# Patient Record
Sex: Female | Born: 2005
Health system: Southern US, Community
[De-identification: ages and names within clinical notes are randomized; demographics above are authoritative.]

## PROBLEM LIST (undated history)

## (undated) DIAGNOSIS — K900 Celiac disease: Secondary | ICD-10-CM

## (undated) DIAGNOSIS — N289 Disorder of kidney and ureter, unspecified: Secondary | ICD-10-CM

## (undated) HISTORY — DX: Celiac disease: K90.0

## (undated) HISTORY — PX: CYSTOSCOPY DEFLUX PROCEDURE: SHX6635

---
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
## 2023-02-18 DIAGNOSIS — D485 Neoplasm of uncertain behavior of skin: Secondary | ICD-10-CM | POA: Diagnosis not present

## 2023-02-18 DIAGNOSIS — L859 Epidermal thickening, unspecified: Secondary | ICD-10-CM | POA: Diagnosis not present

## 2023-02-20 ENCOUNTER — Encounter: Payer: Self-pay | Admitting: Physician Assistant

## 2023-02-20 ENCOUNTER — Ambulatory Visit: Payer: BC Managed Care – PPO | Admitting: Physician Assistant

## 2023-02-20 VITALS — BP 127/71 | HR 86 | Temp 98.2°F | Ht 62.02 in | Wt 126.2 lb

## 2023-02-20 DIAGNOSIS — R109 Unspecified abdominal pain: Secondary | ICD-10-CM | POA: Diagnosis not present

## 2023-02-20 LAB — POCT URINALYSIS DIP (MANUAL ENTRY)
Bilirubin, UA: NEGATIVE
Blood, UA: NEGATIVE
Glucose, UA: NEGATIVE mg/dL
Ketones, POC UA: NEGATIVE mg/dL
Nitrite, UA: NEGATIVE
Protein Ur, POC: NEGATIVE mg/dL
Spec Grav, UA: 1.015 (ref 1.010–1.025)
Urobilinogen, UA: 0.2 U/dL
pH, UA: 6.5 (ref 5.0–8.0)

## 2023-02-20 MED ORDER — BACLOFEN 10 MG PO TABS
10.0000 mg | ORAL_TABLET | Freq: Two times a day (BID) | ORAL | 0 refills | Status: DC
Start: 2023-02-20 — End: 2023-03-05

## 2023-02-20 NOTE — Progress Notes (Signed)
Established patient visit   Patient: Jade Wolfe   DOB: Jul 15, 2005   17 y.o. Female  MRN: [id number] Visit Date: 02/20/2023  Today's healthcare provider: Alfredia Ferguson, PA-C   Chief Complaint  Patient presents with   Back Pain    Patient states she does have scoliosis. Started in both sides- now its the right side, burning sensation. Kinda of leaning down in the leg now. Cramping in the front, lower abdominal.    Subjective    Pt reports right sided burning back pain for the last 2 weeks, but worse over the last 2-3 days. Worse when lying/sitting/bending straight. H/o scoliosis, but no bracing or prior flares.  Denies any injuries.  She has a history of vesicourethral reflux s/p deflux procedure, and history of cysts on right kidney.  She denies hematuria, dysuria, frequency.  Medications: Outpatient Medications Prior to Visit  Medication Sig   fluvoxaMINE (LUVOX) 100 MG tablet Take 100 mg by mouth at bedtime.   meloxicam (MOBIC) 15 MG tablet Take 1 tablet (15 mg total) by mouth daily.   No facility-administered medications prior to visit.   Review of Systems  Constitutional:  Negative for fatigue and fever.  Respiratory:  Negative for cough and shortness of breath.   Cardiovascular:  Negative for chest pain and leg swelling.  Gastrointestinal:  Positive for abdominal pain.  Musculoskeletal:  Positive for back pain.  Neurological:  Negative for dizziness and headaches.       Objective    BP 127/71   Pulse 86   Temp 98.2 F (36.8 C)   Ht 5' 2.02" (1.575 m)   Wt 126 lb 4 oz (57.3 kg)   LMP 01/16/2023 (Approximate)   SpO2 100%   BMI 23.08 kg/m    Physical Exam Vitals reviewed.  Constitutional:      Appearance: She is not ill-appearing.  HENT:     Head: Normocephalic.  Eyes:     Conjunctiva/sclera: Conjunctivae normal.  Cardiovascular:     Rate and Rhythm: Normal rate.  Pulmonary:     Effort: Pulmonary effort is normal. No respiratory distress.   Musculoskeletal:     Comments: Negative straight leg raise bilaterally. No pain with rom of b/l hips . No visible rashes or edema of back  Pain with flexion of lumbar spine  Neurological:     General: No focal deficit present.     Mental Status: She is alert and oriented to person, place, and time.  Psychiatric:        Mood and Affect: Mood normal.        Behavior: Behavior normal.     Results for orders placed or performed in visit on 02/20/23  POCT urinalysis dipstick  Result Value Ref Range   Color, UA yellow yellow   Clarity, UA clear clear   Glucose, UA negative negative mg/dL   Bilirubin, UA negative negative   Ketones, POC UA negative negative mg/dL   Spec Grav, UA 4.098 1.191 - 1.025   Blood, UA negative negative   pH, UA 6.5 5.0 - 8.0   Protein Ur, POC negative negative mg/dL   Urobilinogen, UA 0.2 0.2 or 1.0 E.U./dL   Nitrite, UA Negative Negative   Leukocytes, UA Trace (A) Negative    Assessment & Plan    Right flank pain -     POCT urinalysis dipstick -     US RENAL -     Baclofen; Take 1 tablet (10 mg total) by  mouth 2 (two) times daily.  Dispense: 14 each; Refill: 0 -     Urine Culture   UA with trace leuks-- will send for culture Given h/o ordering renal US  Will also tx as muscle strain w/ baclofen  Return if symptoms worsen or fail to improve.      Alfredia Ferguson, PA-C  [facility] at [facility] [phone] (phone) [phone] (fax)  [facility]

## 2023-02-21 LAB — URINE CULTURE
MICRO NUMBER:: 15731286
Result:: NO GROWTH
SPECIMEN QUALITY:: ADEQUATE

## 2023-02-21 NOTE — Telephone Encounter (Signed)
Called and left detailed message on VM.

## 2023-02-22 ENCOUNTER — Ambulatory Visit (HOSPITAL_BASED_OUTPATIENT_CLINIC_OR_DEPARTMENT_OTHER)
Admission: RE | Admit: 2023-02-22 | Discharge: 2023-02-22 | Disposition: A | Discharge status: Home / Self Care | Payer: BC Managed Care – PPO | Source: Ambulatory Visit | Attending: Physician Assistant | Admitting: Physician Assistant

## 2023-02-22 DIAGNOSIS — R109 Unspecified abdominal pain: Secondary | ICD-10-CM | POA: Diagnosis not present

## 2023-02-22 DIAGNOSIS — N261 Atrophy of kidney (terminal): Secondary | ICD-10-CM | POA: Diagnosis not present

## 2023-02-24 ENCOUNTER — Encounter: Payer: Self-pay | Admitting: Physician Assistant

## 2023-02-24 ENCOUNTER — Other Ambulatory Visit: Payer: Self-pay | Admitting: Physician Assistant

## 2023-02-24 DIAGNOSIS — R109 Unspecified abdominal pain: Secondary | ICD-10-CM

## 2023-02-24 DIAGNOSIS — R93429 Abnormal radiologic findings on diagnostic imaging of unspecified kidney: Secondary | ICD-10-CM

## 2023-02-24 DIAGNOSIS — Z87448 Personal history of other diseases of urinary system: Secondary | ICD-10-CM

## 2023-03-04 DIAGNOSIS — S9031XA Contusion of right foot, initial encounter: Secondary | ICD-10-CM | POA: Diagnosis not present

## 2023-03-04 DIAGNOSIS — L700 Acne vulgaris: Secondary | ICD-10-CM | POA: Diagnosis not present

## 2023-03-04 DIAGNOSIS — D485 Neoplasm of uncertain behavior of skin: Secondary | ICD-10-CM | POA: Diagnosis not present

## 2023-03-05 ENCOUNTER — Encounter: Payer: Self-pay | Admitting: Family Medicine

## 2023-03-05 ENCOUNTER — Ambulatory Visit: Payer: BC Managed Care – PPO | Admitting: Family Medicine

## 2023-03-05 VITALS — BP 110/64 | HR 87 | Temp 98.0°F | Resp 16 | Ht 62.0 in | Wt 125.6 lb

## 2023-03-05 DIAGNOSIS — Z0000 Encounter for general adult medical examination without abnormal findings: Secondary | ICD-10-CM | POA: Diagnosis not present

## 2023-03-05 DIAGNOSIS — Z23 Encounter for immunization: Secondary | ICD-10-CM

## 2023-03-05 NOTE — Patient Instructions (Signed)
Give us 2-3 business days to get the results of your labs back.   Keep the diet clean and stay active.  Let us know if you need anything. 

## 2023-03-05 NOTE — Progress Notes (Signed)
Chief Complaint  Patient presents with   Annual Exam    Annual Exam     Well Woman Jade Wolfe is here for a complete physical.   Her last physical was >1 year ago.  Current diet: in general, a "healthy" diet. Current exercise: cardio, lift wts. Fatigue out of ordinary? No Seatbelt? Yes  Health Maintenance Tetanus- Yes HIV screening- Yes Hep C screening- Yes  Past Medical History:  Diagnosis Date   Celiac disease      Past Surgical History:  Procedure Laterality Date   CYSTOSCOPY DEFLUX PROCEDURE      Medications  Current Outpatient Medications on File Prior to Visit  Medication Sig Dispense Refill   meloxicam (MOBIC) 15 MG tablet Take 1 tablet (15 mg total) by mouth daily. 30 tablet 0    Allergies Allergies  Allergen Reactions   Gluten Meal Nausea And Vomiting    Celiac disease Other reaction(s): GI Upset (intolerance) Celiac disease     Review of Systems: Constitutional:  no unexpected weight changes Eye:  no recent significant change in vision Ear/Nose/Mouth/Throat:  Ears:  no tinnitus or vertigo and no recent change in hearing Nose/Mouth/Throat:  no complaints of nasal congestion, no sore throat Cardiovascular: no chest pain Respiratory:  no cough and no shortness of breath Gastrointestinal:  no abdominal pain, no change in bowel habits GU:  Female: negative for dysuria or pelvic pain Musculoskeletal/Extremities:  no pain of the joints Integumentary (Skin/Breast):  no abnormal skin lesions reported Neurologic:  no headaches Endocrine:  denies fatigue Hematologic/Lymphatic:  No areas of easy bleeding  Exam BP 110/64 (BP Location: Left Arm, Patient Position: Sitting, Cuff Size: Normal)   Pulse 87   Temp 98 F (36.7 C) (Oral)   Resp 16   Ht 5\' 2"  (1.575 m)   Wt 125 lb 9.6 oz (57 kg)   LMP 01/16/2023 (Approximate)   SpO2 98%   BMI 22.97 kg/m  General:  well developed, well nourished, in no apparent distress Skin:  no significant moles,  warts, or growths Head:  no masses, lesions, or tenderness Eyes:  pupils equal and round, sclera anicteric without injection Ears:  canals without lesions, TMs shiny without retraction, no obvious effusion, no erythema Nose:  nares patent, mucosa normal, and no drainage  Throat/Pharynx:  lips and gingiva without lesion; tongue and uvula midline; non-inflamed pharynx; no exudates or postnasal drainage Neck: neck supple without adenopathy, thyromegaly, or masses Lungs:  clear to auscultation, breath sounds equal bilaterally, no respiratory distress Cardio:  regular rate and rhythm, no bruits, no LE edema Abdomen:  abdomen soft, nontender; bowel sounds normal; no masses or organomegaly Genital: Defer to GYN Musculoskeletal:  symmetrical muscle groups noted without atrophy or deformity Extremities:  no clubbing, cyanosis, or edema, no deformities, no skin discoloration Neuro:  gait normal; deep tendon reflexes normal and symmetric Psych: well oriented with normal range of affect and appropriate judgment/insight  Assessment and Plan  Well adult exam - Plan: CBC, Comprehensive metabolic panel, Lipid panel  Well 17 y.o. female. Counseled on diet and exercise. Flu shot today. HPV vaccine 1/3. 2/3 in 1 mo. 3/3 in 6 mo.  Other orders as above. Follow up 1 yr. The patient voiced understanding and agreement to the plan.  Jilda Roche [place], DO 03/05/23 2:18 PM

## 2023-03-05 NOTE — Addendum Note (Signed)
Addended by: Kathi Ludwig on: 03/05/2023 02:41 PM   Modules accepted: Orders

## 2023-03-06 LAB — COMPREHENSIVE METABOLIC PANEL
AG Ratio: 1.5 (calc) (ref 1.0–2.5)
ALT: 7 U/L (ref 5–32)
AST: 15 U/L (ref 12–32)
Albumin: 4.5 g/dL (ref 3.6–5.1)
Alkaline phosphatase (APISO): 65 U/L (ref 36–128)
BUN: 10 mg/dL (ref 7–20)
CO2: 26 mmol/L (ref 20–32)
Calcium: 9.7 mg/dL (ref 8.9–10.4)
Chloride: 105 mmol/L (ref 98–110)
Creat: 0.77 mg/dL (ref 0.50–0.96)
Globulin: 3.1 g/dL (ref 2.0–3.8)
Glucose, Bld: 76 mg/dL (ref 65–99)
Potassium: 4 mmol/L (ref 3.8–5.1)
Sodium: 139 mmol/L (ref 135–146)
Total Bilirubin: 0.5 mg/dL (ref 0.2–1.1)
Total Protein: 7.6 g/dL (ref 6.3–8.2)

## 2023-03-06 LAB — CBC
HCT: 39.6 % (ref 35.0–45.0)
Hemoglobin: 12.9 g/dL (ref 11.7–15.5)
MCH: 29.9 pg (ref 27.0–33.0)
MCHC: 32.6 g/dL (ref 32.0–36.0)
MCV: 91.7 fL (ref 80.0–100.0)
MPV: 12 fL (ref 7.5–12.5)
Platelets: 274 10*3/uL (ref 140–400)
RBC: 4.32 10*6/uL (ref 3.80–5.10)
RDW: 13.2 % (ref 11.0–15.0)
WBC: 5.6 10*3/uL (ref 3.8–10.8)

## 2023-03-06 LAB — LIPID PANEL
Cholesterol: 159 mg/dL (ref <170)
HDL: 56 mg/dL (ref >45)
LDL Cholesterol (Calc): 81 mg/dL (ref <110)
Non-HDL Cholesterol (Calc): 103 mg/dL (ref <120)
Total CHOL/HDL Ratio: 2.8 (calc) (ref <5.0)
Triglycerides: 123 mg/dL — ABNORMAL HIGH (ref <90)

## 2023-03-11 ENCOUNTER — Encounter: Payer: Self-pay | Admitting: Urology

## 2023-03-11 ENCOUNTER — Ambulatory Visit: Payer: BC Managed Care – PPO | Admitting: Urology

## 2023-03-11 VITALS — BP 118/77 | HR 76 | Ht 62.0 in | Wt 125.0 lb

## 2023-03-11 DIAGNOSIS — R109 Unspecified abdominal pain: Secondary | ICD-10-CM

## 2023-03-11 DIAGNOSIS — Q625 Duplication of ureter: Secondary | ICD-10-CM

## 2023-03-11 LAB — URINALYSIS, ROUTINE W REFLEX MICROSCOPIC
Bilirubin, UA: NEGATIVE
Glucose, UA: NEGATIVE
Ketones, UA: NEGATIVE
Leukocytes,UA: NEGATIVE
Nitrite, UA: NEGATIVE
Protein,UA: NEGATIVE
RBC, UA: NEGATIVE
Specific Gravity, UA: 1.02 (ref 1.005–1.030)
Urobilinogen, Ur: 0.2 mg/dL (ref 0.2–1.0)
pH, UA: 6 (ref 5.0–7.5)

## 2023-03-11 NOTE — Progress Notes (Signed)
Assessment: 1. Flank pain   2. Ureteral duplication, right      Plan: Today I had a long discussion with the patient regarding her prior urologic history.  As noted she is having no issues with infections with the main question being because she have an obstructive component that could be contributing to her pain.  Today also called and discussed her case with Dr. Antonieta Pert who treated her as a child and performed the deflux.   Following our discussion today we will proceed with a MAG3 renogram without a Foley catheter in place.  This could allow [place] to rule out obstruction and also may incidentally demonstrate reflux.  Patient will return following renogram.  Chief Complaint: KIDNEY ISSUES  History of Present Illness:  Jade Wolfe is a 17 y.o. female who is seen in consultation from [place], Jilda Roche, DO for evaluation of RIGHT KIDNEY ABNORMALITY. She has a history of vesicoureteral reflux s/p deflux 2011 previously followed by peds urology-Dr. Yetta Flock at [facility], celiac disease, migraine headaches and chronic abdominal pain.   Patient has done well from a urologic standpoint for the past 10 years but over the last several months she has had some back pain.  She does have scoliosis.  She reports bilateral back and hip pain but on occasion she has noticed some pressure in her flank area with voiding raising the question of recurrent reflux.  No problems with infections.  CT A/P with contrast 2017 at [facility]--  Kidneys: No hydronephrosis. Scarring in the upper pole of the right kidney with dilated upper pole calyx. There is likely duplication of the right intrarenal collecting system, as was seen on the prior studies.  There is a 1.1 cm cyst in the interpolar region of the lower moiety of the right kidney.    PELVIS:  . Ureters: Duplicated right collecting system, with both ureters visualized to the level of the pelvis. However, the right ureters are difficult to trace in the  anatomic pelvis, but the distal ureters were seen on prior cystogram from 2008.  .  Bladder: Within normal limits.   Renal ultrasound 02/22/2023-- IMPRESSION: 1. Atrophic upper moiety of the right kidney with cortical thinning and increased echogenicity, likely sequela of chronic upper moiety obstruction in the setting of known duplicated renal collecting system resulting in asymmetric renal sizes. Upper moiety cystic focus likely corresponds to a calyceal diverticulum. Additional cysts on prior CT is not well seen on this examination. 2. Nonshadowing right upper moiety echogenicity measuring 5 mm appears to correspond to a prominent papilla on cine images. If there is clinical concern for nephrolithiasis, a CT of the abdomen and pelvis can be considered for further evaluation. 3. Mildly dilated lower moiety pelvis without central or peripheral calyceal dilation, likely sequela of vesicoureteral reflux. 4. Small volume echogenic debris within the urinary bladder, which may be seen in the setting of cystitis. Recommend correlation with urinalysis. 5. Normal sonographic appearance of the left kidney.  Past Medical History:  Past Medical History:  Diagnosis Date   Celiac disease     Past Surgical History:  Past Surgical History:  Procedure Laterality Date   CYSTOSCOPY DEFLUX PROCEDURE      Allergies:  Allergies  Allergen Reactions   Gluten Meal Nausea And Vomiting    Celiac disease Other reaction(s): GI Upset (intolerance) Celiac disease     Family History:  Family History  Problem Relation Age of Onset   Fibromyalgia Mother    Headache Mother  Hyperlipidemia Father    Diabetes Paternal Grandfather    Celiac disease Neg Hx    Heart disease Neg Hx    Cancer Neg Hx     Social History:  Social History   Tobacco Use   Smoking status: Never   Smokeless tobacco: Never  Vaping Use   Vaping status: Never Used  Substance Use Topics   Alcohol use: No   Drug use:  No    Review of symptoms:  Constitutional:  Negative for unexplained weight loss, night sweats, fever, chills ENT:  Negative for nose bleeds, sinus pain, painful swallowing CV:  Negative for chest pain, shortness of breath, exercise intolerance, palpitations, loss of consciousness Resp:  Negative for cough, wheezing, shortness of breath GI:  Negative for nausea, vomiting, diarrhea, bloody stools GU:  Positives noted in HPI; otherwise negative for gross hematuria, dysuria, urinary incontinence Neuro:  Negative for seizures, poor balance, limb weakness, slurred speech Psych:  Negative for lack of energy, depression, anxiety Endocrine:  Negative for polydipsia, polyuria, symptoms of hypoglycemia (dizziness, hunger, sweating) Hematologic:  Negative for anemia, purpura, petechia, prolonged or excessive bleeding, use of anticoagulants  Allergic:  Negative for difficulty breathing or choking as a result of exposure to anything; no shellfish allergy; no allergic response (rash/itch) to materials, foods  Physical exam: BP 118/77   Pulse 76   Ht 5\' 2"  (1.575 m)   Wt 125 lb (56.7 kg)   LMP 01/16/2023 (Approximate)   BMI 22.86 kg/m  GENERAL APPEARANCE:  Well appearing, well developed, well nourished, NAD

## 2023-06-02 DIAGNOSIS — L700 Acne vulgaris: Secondary | ICD-10-CM | POA: Diagnosis not present

## 2023-06-09 ENCOUNTER — Encounter (HOSPITAL_BASED_OUTPATIENT_CLINIC_OR_DEPARTMENT_OTHER): Payer: Self-pay | Admitting: Emergency Medicine

## 2023-06-09 ENCOUNTER — Emergency Department (HOSPITAL_BASED_OUTPATIENT_CLINIC_OR_DEPARTMENT_OTHER)
Admission: EM | Admit: 2023-06-09 | Discharge: 2023-06-09 | Disposition: A | Discharge status: Home / Self Care | Attending: Emergency Medicine | Admitting: Emergency Medicine

## 2023-06-09 ENCOUNTER — Other Ambulatory Visit: Payer: Self-pay

## 2023-06-09 DIAGNOSIS — R109 Unspecified abdominal pain: Secondary | ICD-10-CM | POA: Diagnosis not present

## 2023-06-09 DIAGNOSIS — N898 Other specified noninflammatory disorders of vagina: Secondary | ICD-10-CM | POA: Insufficient documentation

## 2023-06-09 LAB — URINALYSIS, ROUTINE W REFLEX MICROSCOPIC
Bilirubin Urine: NEGATIVE
Glucose, UA: NEGATIVE mg/dL
Hgb urine dipstick: NEGATIVE
Ketones, ur: NEGATIVE mg/dL
Leukocytes,Ua: NEGATIVE
Nitrite: NEGATIVE
Protein, ur: 30 mg/dL — AB
Specific Gravity, Urine: 1.025 (ref 1.005–1.030)
pH: 7 (ref 5.0–8.0)

## 2023-06-09 LAB — CBC
HCT: 39.6 % (ref 36.0–46.0)
Hemoglobin: 13.1 g/dL (ref 12.0–15.0)
MCH: 30 pg (ref 26.0–34.0)
MCHC: 33.1 g/dL (ref 30.0–36.0)
MCV: 90.6 fL (ref 80.0–100.0)
Platelets: 376 10*3/uL (ref 150–400)
RBC: 4.37 MIL/uL (ref 3.87–5.11)
RDW: 13 % (ref 11.5–15.5)
WBC: 9.1 10*3/uL (ref 4.0–10.5)
nRBC: 0 % (ref 0.0–0.2)

## 2023-06-09 LAB — COMPREHENSIVE METABOLIC PANEL
ALT: 28 U/L (ref 0–44)
AST: 59 U/L — ABNORMAL HIGH (ref 15–41)
Albumin: 4.1 g/dL (ref 3.5–5.0)
Alkaline Phosphatase: 66 U/L (ref 38–126)
Anion gap: 12 (ref 5–15)
BUN: 7 mg/dL (ref 6–20)
CO2: 22 mmol/L (ref 22–32)
Calcium: 9.5 mg/dL (ref 8.9–10.3)
Chloride: 102 mmol/L (ref 98–111)
Creatinine, Ser: 0.76 mg/dL (ref 0.44–1.00)
GFR, Estimated: >60 mL/min (ref >60)
Glucose, Bld: 94 mg/dL (ref 70–99)
Potassium: 4 mmol/L (ref 3.5–5.1)
Sodium: 136 mmol/L (ref 135–145)
Total Bilirubin: 0.8 mg/dL (ref 0.0–1.2)
Total Protein: 7.9 g/dL (ref 6.5–8.1)

## 2023-06-09 LAB — URINALYSIS, MICROSCOPIC (REFLEX): WBC, UA: NONE SEEN WBC/hpf (ref 0–5)

## 2023-06-09 LAB — PREGNANCY, URINE: Preg Test, Ur: NEGATIVE

## 2023-06-09 MED ORDER — KETOROLAC TROMETHAMINE 15 MG/ML IJ SOLN
15.0000 mg | Freq: Once | INTRAMUSCULAR | Status: AC
  Administered 2023-06-09: 15 mg via INTRAMUSCULAR
  Filled 2023-06-09: qty 1

## 2023-06-09 NOTE — ED Notes (Signed)
 ED Provider at bedside.

## 2023-06-09 NOTE — Discharge Instructions (Signed)
 Please read and follow all provided instructions.  Your diagnoses today include:  1. Right flank pain     Tests performed today include: Blood cell counts and platelets: Normal white blood cell and red blood cell count Kidney and liver function tests: Normal kidney function test Urine test to look for infection: No signs of infection or bleeding A blood or urine test for pregnancy (women only): Negative Vital signs. See below for your results today.   Medications prescribed:  Please use over-the-counter NSAID medications (ibuprofen, naproxen) or Tylenol (acetaminophen) as directed on the packaging for pain -- as long as you do not have any reasons avoid these medications. Reasons to avoid NSAID medications include: weak kidneys, a history of bleeding in your stomach or gut, or uncontrolled high blood pressure or previous heart attack. Reasons to avoid Tylenol include: liver problems or ongoing alcohol use. Never take more than 4000mg  or 8 Extra strength Tylenol in a 24 hour period.     Take any prescribed medications only as directed.  Home care instructions:  Follow any educational materials contained in this packet.  Follow-up instructions: Please follow-up with your primary care provider in the next 2 days for further evaluation of your symptoms.  Please follow-up with your urologist as planned for your testing on Wednesday.  Return instructions:  SEEK IMMEDIATE MEDICAL ATTENTION IF: The pain does not go away or becomes severe  A temperature above 101F develops  Repeated vomiting occurs (multiple episodes)  The pain becomes localized to portions of the abdomen. The right side could possibly be appendicitis. In an adult, the left lower portion of the abdomen could be colitis or diverticulitis.  Blood is being passed in stools or vomit (bright red or black tarry stools)  You develop chest pain, difficulty breathing, dizziness or fainting, or become confused, poorly responsive, or  inconsolable (young children) If you have any other emergent concerns regarding your health  Additional Information: Abdominal (belly) pain can be caused by many things. Your caregiver performed an examination and possibly ordered blood/urine tests and imaging (CT scan, x-rays, ultrasound). Many cases can be observed and treated at home after initial evaluation in the emergency department. Even though you are being discharged home, abdominal pain can be unpredictable. Therefore, you need a repeated exam if your pain does not resolve, returns, or worsens. Most patients with abdominal pain don't have to be admitted to the hospital or have surgery, but serious problems like appendicitis and gallbladder attacks can start out as nonspecific pain. Many abdominal conditions cannot be diagnosed in one visit, so follow-up evaluations are very important.  Your vital signs today were: BP 112/73   Pulse 78   Temp 97.6 F (36.4 C) (Oral)   Resp 17   SpO2 100%  If your blood pressure (bp) was elevated above 135/85 this visit, please have this repeated by your doctor within one month. --------------

## 2023-06-09 NOTE — ED Provider Notes (Signed)
 [facility] Provider Note   CSN: [id number] Arrival date & time: 06/09/23  1738     History  Chief Complaint  Patient presents with   Flank Pain    Jade Wolfe is a 18 y.o. female.  Patient with history of vesicoureteral reflux s/p deflux 2011, duplication of the right intrarenal collecting system, celiac disease, migraine headaches and  abdominal pain --presents to the emergency department for evaluation of right-sided flank pain.  She has been having this pain intermittently since prior to December 2024.  She is scheduled for a nuclear medicine test in 2 days scheduled by urology.  This is to help determine if she has an obstructive component of her pain.  She reports worsening pain today.  No nausea, vomiting, diarrhea.  She has a history of UTI but no current urinary infection symptoms including dysuria, hematuria, increased frequency or urgency.  She was recently traveling to [place].  She reports that she had more severe pain during that travel as well.  Pain was worse with movement and walking at times.  No fevers.  No chest pain or shortness of breath.  She has had some vaginal discharge.  She denies being concerned about vaginal infections.  No vaginal bleeding.       Home Medications Prior to Admission medications   Medication Sig Start Date End Date Taking? Authorizing Provider  meloxicam (MOBIC) 15 MG tablet Take 1 tablet (15 mg total) by mouth daily. Patient not taking: Reported on 03/11/2023 08/29/22   Sharlene Dory, DO      Allergies    Gluten meal    Review of Systems   Review of Systems  Physical Exam Updated Vital Signs BP 112/73   Pulse 78   Temp 97.6 F (36.4 C) (Oral)   Resp 17   SpO2 100%   Physical Exam Vitals and nursing note reviewed.  Constitutional:      Appearance: She is well-developed.  HENT:     Head: Normocephalic and atraumatic.  Eyes:     Conjunctiva/sclera: Conjunctivae  normal.  Pulmonary:     Effort: No respiratory distress.  Abdominal:     Tenderness: There is abdominal tenderness. There is no guarding or rebound.     Comments: Mild tenderness right flank  Musculoskeletal:     Cervical back: Normal range of motion and neck supple.  Skin:    General: Skin is warm and dry.  Neurological:     Mental Status: She is alert.     ED Results / Procedures / Treatments   Labs (all labs ordered are listed, but only abnormal results are displayed) Labs Reviewed  COMPREHENSIVE METABOLIC PANEL - Abnormal; Notable for the following components:      Result Value   AST 59 (*)    All other components within normal limits  URINALYSIS, ROUTINE W REFLEX MICROSCOPIC - Abnormal; Notable for the following components:   APPearance HAZY (*)    Protein, ur 30 (*)    All other components within normal limits  URINALYSIS, MICROSCOPIC (REFLEX) - Abnormal; Notable for the following components:   Bacteria, UA FEW (*)    All other components within normal limits  CBC  PREGNANCY, URINE    EKG None  Radiology No results found.  Procedures Procedures    Medications Ordered in ED Medications  ketorolac (TORADOL) 15 MG/ML injection 15 mg (15 mg Intramuscular Given 06/09/23 2258)    ED Course/ Medical Decision Making/ A&P  Patient seen and examined. History obtained directly from patient and mother at bedside.  I have also reviewed previous urology notes. Work-up including labs, imaging, EKG ordered in triage, if performed, were reviewed.    Labs/EKG: Independently reviewed and interpreted.  This included: CBC normal white blood cell count hemoglobin; CMP normal kidney function, minimally elevated AST at 59 otherwise unremarkable; UA without signs of infection.  Imaging: None ordered  Medications/Fluids: None ordered  Most recent vital signs reviewed and are as follows: BP 112/73   Pulse 78   Temp 97.6 F (36.4 C) (Oral)   Resp 17   SpO2 100%   Initial  impression: Right-sided abdominal and flank pain, intermittent over the past 3 to 4 months, although it seems to be little bit more severe recently.  Patient has a reassuring exam.  Her workup is reassuring.  We had a discussion at bedside regarding how much to do tonight.  I have offered Toradol for pain.  I have offered wet prep to evaluate the vaginal discharge as well as CT scan to evaluate for other possible etiologies of the patient's symptoms.  We also discussed watchful waiting given that she has a urology test in 2 days which will give additional information.  After discussion, she would like to Toradol.  She is comfortable with discharge at this time.  10:56 PM Reassessment performed. Patient appears comfortable.  Plan: Discharge to home.   Prescriptions written for: None  Other home care instructions discussed: OTC meds  ED return instructions discussed: The patient was urged to return to the Emergency Department immediately with worsening of current symptoms, worsening abdominal pain, persistent vomiting, blood noted in stools, fever, or any other concerns. The patient verbalized understanding.   Follow-up instructions discussed: Patient encouraged to follow-up with their PCP in 2 days.                                 Medical Decision Making Amount and/or Complexity of Data Reviewed Labs: ordered.  Risk Prescription drug management.   For this patient's complaint of abdominal pain, the following conditions were considered on the differential diagnosis: gastritis/PUD, enteritis/duodenitis, appendicitis, cholelithiasis/cholecystitis, cholangitis, pancreatitis, ruptured viscus, colitis, diverticulitis, small/large bowel obstruction, proctitis, cystitis, pyelonephritis, ureteral colic, aortic dissection, aortic aneurysm. In women, ectopic pregnancy, pelvic inflammatory disease, ovarian cysts, and tubo-ovarian abscess were also considered. Atypical chest etiologies were also  considered including ACS, PE, and pneumonia.  Labs do not show evidence of significant infection, UTI, bleeding in the urine, kidney failure.  Low concern clinically for appendicitis, colitis, PID.  The patient's vital signs, pertinent lab work and imaging were reviewed and interpreted as discussed in the ED course. Hospitalization was considered for further testing, treatments, or serial exams/observation. However as patient is well-appearing, has a stable exam, and reassuring studies today, I do not feel that they warrant admission at this time. This plan was discussed with the patient who verbalizes agreement and comfort with this plan and seems reliable and able to return to the Emergency Department with worsening or changing symptoms.          Final Clinical Impression(s) / ED Diagnoses Final diagnoses:  Right flank pain    Rx / DC Orders ED Discharge Orders     None         Renne Crigler, PA-C 06/09/23 2311    Arby Barrette, MD 06/15/23 [phone]

## 2023-06-09 NOTE — ED Triage Notes (Signed)
 Rt flank pain  has 2 collecting ducts rt side  has hx of cysts  just back from [place] last Friday denies dysuria  feels like cramp rt side

## 2023-06-11 ENCOUNTER — Ambulatory Visit (HOSPITAL_COMMUNITY)
Admission: RE | Admit: 2023-06-11 | Discharge: 2023-06-11 | Disposition: A | Discharge status: Home / Self Care | Payer: BC Managed Care – PPO | Source: Ambulatory Visit | Attending: Urology | Admitting: Urology

## 2023-06-11 DIAGNOSIS — R109 Unspecified abdominal pain: Secondary | ICD-10-CM | POA: Insufficient documentation

## 2023-06-11 DIAGNOSIS — K219 Gastro-esophageal reflux disease without esophagitis: Secondary | ICD-10-CM | POA: Diagnosis not present

## 2023-06-11 DIAGNOSIS — N2889 Other specified disorders of kidney and ureter: Secondary | ICD-10-CM | POA: Diagnosis not present

## 2023-06-11 DIAGNOSIS — Q625 Duplication of ureter: Secondary | ICD-10-CM | POA: Insufficient documentation

## 2023-06-11 DIAGNOSIS — N270 Small kidney, unilateral: Secondary | ICD-10-CM | POA: Diagnosis not present

## 2023-06-11 MED ORDER — TECHNETIUM TC 99M MERTIATIDE
5.0000 | Freq: Once | INTRAVENOUS | Status: AC
  Administered 2023-06-11: 4.95 via INTRAVENOUS

## 2023-06-11 MED ORDER — FUROSEMIDE 10 MG/ML IJ SOLN: 28.3500 mg | Freq: Once | INTRAMUSCULAR | Status: DC

## 2023-06-11 MED ORDER — FUROSEMIDE 10 MG/ML IJ SOLN
INTRAMUSCULAR | Status: AC
  Filled 2023-06-11: qty 4

## 2023-06-17 ENCOUNTER — Ambulatory Visit: Payer: BC Managed Care – PPO | Admitting: Urology

## 2023-06-17 ENCOUNTER — Ambulatory Visit: Admitting: Urology

## 2023-06-18 ENCOUNTER — Encounter: Payer: Self-pay | Admitting: Urology

## 2023-06-18 ENCOUNTER — Ambulatory Visit: Admitting: Urology

## 2023-06-18 VITALS — BP 111/76 | HR 77

## 2023-06-18 DIAGNOSIS — R109 Unspecified abdominal pain: Secondary | ICD-10-CM | POA: Diagnosis not present

## 2023-06-18 LAB — MICROSCOPIC EXAMINATION

## 2023-06-18 LAB — URINALYSIS, ROUTINE W REFLEX MICROSCOPIC
Bilirubin, UA: NEGATIVE
Glucose, UA: NEGATIVE
Ketones, UA: NEGATIVE
Nitrite, UA: NEGATIVE
Protein,UA: NEGATIVE
RBC, UA: NEGATIVE
Specific Gravity, UA: 1.02 (ref 1.005–1.030)
Urobilinogen, Ur: 0.2 mg/dL (ref 0.2–1.0)
pH, UA: 6.5 (ref 5.0–7.5)

## 2023-06-18 NOTE — Progress Notes (Signed)
   Assessment: 1. Flank pain     Plan: Today I reviewed the findings of the renogram which suggests chronic issues well-documented previously with a poorly functioning right kidney.  Not sure if this is related in any way to her right sided pain. Will refer her to Dr. Antonieta Pert in peds urology at [facility] who is cared for her previously for second opinion and further evaluation  Chief Complaint: Right side pain  HPI: Jade Wolfe is a 18 y.o. female who presents for after renogram.  Accompanied by mom today. She has a history of vesicoureteral reflux s/p deflux 2011 previously followed by peds urology-Dr. Yetta Flock at [facility], celiac disease, migraine headaches and chronic abdominal pain.  Patient has continued to have intermittent right flank as well as right lower quadrant pain.  Sometimes it occurs when she voids. Renogram reviewed in detail today with the patient and her mom. This showed chronic changes with a poorly functioning right kidney (27%) with no evidence of obstruction or reflux  Portions of the above documentation were copied from a prior visit for review purposes only.  Allergies: Allergies  Allergen Reactions   Gluten Meal Nausea And Vomiting    Celiac disease Other reaction(s): GI Upset (intolerance) Celiac disease     PMH: Past Medical History:  Diagnosis Date   Celiac disease    Renal disorder     PSH: Past Surgical History:  Procedure Laterality Date   CYSTOSCOPY DEFLUX PROCEDURE      SH: Social History   Tobacco Use   Smoking status: Never   Smokeless tobacco: Never  Vaping Use   Vaping status: Never Used  Substance Use Topics   Alcohol use: No   Drug use: No    ROS: Constitutional:  Negative for fever, chills, weight loss CV: Negative for chest pain, previous MI, hypertension Respiratory:  Negative for shortness of breath, wheezing, sleep apnea, frequent cough GI:  Negative for nausea, vomiting, bloody stool, GERD  PE: BP 111/76    Pulse 77   LMP 05/23/2023 (Approximate)  GENERAL APPEARANCE:  Well appearing, well developed, well nourished, NAD    Results: UA is negative for significant blood or infection

## 2023-07-30 DIAGNOSIS — N23 Unspecified renal colic: Secondary | ICD-10-CM | POA: Diagnosis not present

## 2023-09-11 DIAGNOSIS — Q625 Duplication of ureter: Secondary | ICD-10-CM | POA: Diagnosis not present

## 2023-09-11 DIAGNOSIS — N1330 Unspecified hydronephrosis: Secondary | ICD-10-CM | POA: Diagnosis not present

## 2023-09-11 DIAGNOSIS — N261 Atrophy of kidney (terminal): Secondary | ICD-10-CM | POA: Diagnosis not present

## 2023-10-14 DIAGNOSIS — Q625 Duplication of ureter: Secondary | ICD-10-CM | POA: Diagnosis not present

## 2023-10-14 DIAGNOSIS — N261 Atrophy of kidney (terminal): Secondary | ICD-10-CM | POA: Diagnosis not present

## 2023-10-14 DIAGNOSIS — N1330 Unspecified hydronephrosis: Secondary | ICD-10-CM | POA: Diagnosis not present

## 2023-10-23 DIAGNOSIS — Z5181 Encounter for therapeutic drug level monitoring: Secondary | ICD-10-CM | POA: Diagnosis not present

## 2023-10-23 DIAGNOSIS — F429 Obsessive-compulsive disorder, unspecified: Secondary | ICD-10-CM | POA: Diagnosis not present

## 2023-10-23 DIAGNOSIS — F428 Other obsessive-compulsive disorder: Secondary | ICD-10-CM | POA: Diagnosis not present

## 2023-10-23 DIAGNOSIS — F4321 Adjustment disorder with depressed mood: Secondary | ICD-10-CM | POA: Diagnosis not present

## 2023-10-23 DIAGNOSIS — F411 Generalized anxiety disorder: Secondary | ICD-10-CM | POA: Diagnosis not present

## 2023-10-23 DIAGNOSIS — Z79899 Other long term (current) drug therapy: Secondary | ICD-10-CM | POA: Diagnosis not present

## 2023-10-31 DIAGNOSIS — Q625 Duplication of ureter: Secondary | ICD-10-CM | POA: Diagnosis not present

## 2023-10-31 DIAGNOSIS — N1330 Unspecified hydronephrosis: Secondary | ICD-10-CM | POA: Diagnosis not present

## 2023-10-31 DIAGNOSIS — N130 Hydronephrosis with ureteropelvic junction obstruction: Secondary | ICD-10-CM | POA: Diagnosis not present

## 2023-10-31 DIAGNOSIS — R109 Unspecified abdominal pain: Secondary | ICD-10-CM | POA: Diagnosis not present

## 2023-11-12 ENCOUNTER — Ambulatory Visit: Payer: Self-pay

## 2023-11-12 ENCOUNTER — Ambulatory Visit: Admitting: Family Medicine

## 2023-11-12 ENCOUNTER — Encounter: Payer: Self-pay | Admitting: Family Medicine

## 2023-11-12 VITALS — BP 105/62 | HR 100 | Temp 98.0°F | Resp 16 | Ht 62.0 in | Wt 121.2 lb

## 2023-11-12 DIAGNOSIS — R82998 Other abnormal findings in urine: Secondary | ICD-10-CM

## 2023-11-12 DIAGNOSIS — R42 Dizziness and giddiness: Secondary | ICD-10-CM | POA: Diagnosis not present

## 2023-11-12 NOTE — Patient Instructions (Addendum)
 Give us  2-3 business days to get the results of your labs back.   Try to drink 55-60 oz of water daily outside of exercise.  Consider compression stockings.   Get up slowly.   Consider adding electrolytes while this is happening.   Let us  know if you need anything.

## 2023-11-12 NOTE — Telephone Encounter (Signed)
 FYI Only or Action Required?: Action required by provider: request for appointment.  Patient was last seen in primary care on 03/05/2023 by Jade Mabel Mt, DO.  Called Nurse Triage reporting Dizziness.  Symptoms began several weeks ago.  Interventions attempted: Nothing.  Symptoms are: unchanged. Dizziness x 2 weeks. Fainted 2 weeks ago.Not dizzy now. Recently diagnosed with kidney disease.  Triage Disposition: See Physician Within 24 Hours  Patient/caregiver understands and will follow disposition?: Yes   Copied from CRM #[id number]. Topic: Clinical - Red Word Triage >> Nov 12, 2023 12:07 PM Deleta RAMAN wrote: Red Word that prompted transfer to Nurse Triage: patient has low blood pressure, dizziness, fainted twice 2 weeks ago. Patient was recently diagnosed with stage four kidney disease Reason for Disposition  [1] MODERATE dizziness (e.g., interferes with normal activities) AND [2] has NOT been evaluated by doctor (or NP/PA) for this  (Exception: Dizziness caused by heat exposure, sudden standing, or poor fluid intake.)  Answer Assessment - Initial Assessment Questions 1. DESCRIPTION: Describe your dizziness.     Lightheadedness 2. LIGHTHEADED: Do you feel lightheaded? (e.g., somewhat faint, woozy, weak upon standing)     yes 3. VERTIGO: Do you feel like either you or the room is spinning or tilting? (i.e., vertigo)     no 4. SEVERITY: How bad is it?  Do you feel like you are going to faint? Can you stand and walk?     no 5. ONSET:  When did the dizziness begin?     2 weeks 6. AGGRAVATING FACTORS: Does anything make it worse? (e.g., standing, change in head position)     With movement 7. HEART RATE: Can you tell me your heart rate? How many beats in 15 seconds?  (Note: Not all patients can do this.)       no 8. CAUSE: What do you think is causing the dizziness? (e.g., decreased fluids or food, diarrhea, emotional distress, heat exposure, new medicine,  sudden standing, vomiting; unknown)     Maybe low BP 9. RECURRENT SYMPTOM: Have you had dizziness before? If Yes, ask: When was the last time? What happened that time?     NO 10. OTHER SYMPTOMS: Do you have any other symptoms? (e.g., fever, chest pain, vomiting, diarrhea, bleeding)       Fainted 2 weeks ago 11. PREGNANCY: Is there any chance you are pregnant? When was your last menstrual period?       no  Protocols used: Dizziness - Lightheadedness-A-AH

## 2023-11-12 NOTE — Progress Notes (Signed)
 Chief Complaint  Patient presents with   Dizziness    Dizziness     Jade Wolfe is 18 y.o. pt here for dizziness.  Duration: 2 weeks Pass out? Yes Lightheadedness. Spinning? No Recent illness/fever? No Headache? No Neurologic signs? No Change in PO intake? No Palpitations? No No bleeding, urinary dc/bleeding/pain, N/V/D.  Urine has been more concentrated.  She reports being thirsty as well.  She has not been exercising. Caffeine/alcohol/illicit drugs? No  Past Medical History:  Diagnosis Date   Celiac disease    Renal disorder     BP 105/62 (BP Location: Left Arm, Patient Position: Sitting)   Pulse 100   Temp 98 F (36.7 C) (Oral)   Resp 16   Ht 5' 2 (1.575 m)   Wt 121 lb 3.2 oz (55 kg)   SpO2 99%   BMI 22.17 kg/m  General: Awake, alert, appears stated age Eyes: PERRLA, EOMi Ears: Patent, TM's neg b/l Heart: RRR, no murmurs, no carotid bruits Lungs: CTAB, no accessory muscle use MSK: 5/5 strength throughout, gait normal Neuro: No cerebellar signs, patellar reflex 3/4 b/l wo clonus, calcaneal reflex 1/4 b/l wo clonus, biceps reflex 2/4 b/l wo clonus Psych: Age appropriate judgment and insight, normal mood and affect  Lightheaded - Plan: CBC, Comprehensive metabolic panel with GFR, TSH  Dark urine - Plan: Urine Culture, Urinalysis, Routine w reflex microscopic  Stay hydrated.  She has been taking 32 ounces of water daily.  This needs to increase.  Rule out infection and other metabolic contributors.  Consider compression stockings. F/u prn. Pt voiced understanding and agreement to the plan.  Mabel Mt [place], DO 11/12/23 4:42 PM

## 2023-11-13 ENCOUNTER — Ambulatory Visit: Payer: Self-pay | Admitting: Family Medicine

## 2023-11-13 LAB — CBC
HCT: 37.5 % (ref 36.0–46.0)
Hemoglobin: 12.4 g/dL (ref 12.0–15.0)
MCHC: 33 g/dL (ref 30.0–36.0)
MCV: 91.8 fl (ref 78.0–100.0)
Platelets: 274 K/uL (ref 150.0–400.0)
RBC: 4.09 Mil/uL (ref 3.87–5.11)
RDW: 13.9 % (ref 11.5–14.6)
WBC: 4.7 K/uL (ref 4.5–10.5)

## 2023-11-13 LAB — URINALYSIS, ROUTINE W REFLEX MICROSCOPIC
Bilirubin Urine: NEGATIVE
Hgb urine dipstick: NEGATIVE
Ketones, ur: NEGATIVE
Leukocytes,Ua: NEGATIVE
Nitrite: NEGATIVE
RBC / HPF: NONE SEEN (ref 0–?)
Specific Gravity, Urine: 1.01 (ref 1.000–1.030)
Total Protein, Urine: NEGATIVE
Urine Glucose: NEGATIVE
Urobilinogen, UA: 0.2 (ref 0.0–1.0)
pH: 6 (ref 5.0–8.0)

## 2023-11-13 LAB — COMPREHENSIVE METABOLIC PANEL WITH GFR
ALT: 7 U/L (ref 0–35)
AST: 14 U/L (ref 0–37)
Albumin: 4.4 g/dL (ref 3.5–5.2)
Alkaline Phosphatase: 60 U/L (ref 39–117)
BUN: 10 mg/dL (ref 6–23)
CO2: 24 meq/L (ref 19–32)
Calcium: 9.1 mg/dL (ref 8.4–10.5)
Chloride: 102 meq/L (ref 96–112)
Creatinine, Ser: 0.7 mg/dL (ref 0.40–1.20)
GFR: 124.55 mL/min (ref >60.00)
Glucose, Bld: 101 mg/dL — ABNORMAL HIGH (ref 70–99)
Potassium: 4.2 meq/L (ref 3.5–5.1)
Sodium: 139 meq/L (ref 135–145)
Total Bilirubin: 0.5 mg/dL (ref 0.2–1.2)
Total Protein: 7 g/dL (ref 6.0–8.3)

## 2023-11-13 LAB — URINE CULTURE
MICRO NUMBER:: 16794964
Result:: NO GROWTH
SPECIMEN QUALITY:: ADEQUATE

## 2023-11-13 LAB — TSH: TSH: 0.46 u[IU]/mL (ref 0.35–5.50)

## 2023-11-18 ENCOUNTER — Ambulatory Visit: Admitting: Family Medicine

## 2023-11-20 DIAGNOSIS — F411 Generalized anxiety disorder: Secondary | ICD-10-CM | POA: Diagnosis not present

## 2023-11-20 DIAGNOSIS — F4321 Adjustment disorder with depressed mood: Secondary | ICD-10-CM | POA: Diagnosis not present

## 2023-11-20 DIAGNOSIS — F428 Other obsessive-compulsive disorder: Secondary | ICD-10-CM | POA: Diagnosis not present

## 2023-12-25 DIAGNOSIS — F411 Generalized anxiety disorder: Secondary | ICD-10-CM | POA: Diagnosis not present

## 2023-12-25 DIAGNOSIS — F4321 Adjustment disorder with depressed mood: Secondary | ICD-10-CM | POA: Diagnosis not present

## 2023-12-25 DIAGNOSIS — F428 Other obsessive-compulsive disorder: Secondary | ICD-10-CM | POA: Diagnosis not present

## 2024-01-14 DIAGNOSIS — F428 Other obsessive-compulsive disorder: Secondary | ICD-10-CM | POA: Diagnosis not present

## 2024-01-14 DIAGNOSIS — Q625 Duplication of ureter: Secondary | ICD-10-CM | POA: Diagnosis not present

## 2024-01-14 DIAGNOSIS — N135 Crossing vessel and stricture of ureter without hydronephrosis: Secondary | ICD-10-CM | POA: Diagnosis not present

## 2024-01-14 DIAGNOSIS — F411 Generalized anxiety disorder: Secondary | ICD-10-CM | POA: Diagnosis not present

## 2024-01-14 DIAGNOSIS — Z87448 Personal history of other diseases of urinary system: Secondary | ICD-10-CM | POA: Diagnosis not present

## 2024-01-14 DIAGNOSIS — N2889 Other specified disorders of kidney and ureter: Secondary | ICD-10-CM | POA: Diagnosis not present

## 2024-01-14 DIAGNOSIS — F4321 Adjustment disorder with depressed mood: Secondary | ICD-10-CM | POA: Diagnosis not present

## 2024-01-14 DIAGNOSIS — N200 Calculus of kidney: Secondary | ICD-10-CM | POA: Diagnosis not present

## 2024-01-14 DIAGNOSIS — Z466 Encounter for fitting and adjustment of urinary device: Secondary | ICD-10-CM | POA: Diagnosis not present

## 2024-01-27 NOTE — Telephone Encounter (Signed)
 Patient returned call regarding cramping in kidney area.   Call back [phone]

## 2024-01-27 NOTE — Telephone Encounter (Signed)
 Patient called saying that she was having cramping and pain on her right side as well as delayed urination.  Patient with history of duplicated right ureter, hydronephrosis and atrophic kidney.  Patient s/p cystoscopy ureteroscopy with retrograde pyelogram and right ureteral stent placement on 10/31/2023.  Post op appt on 11/07/2023 for stent removal.  Per pts EMR, the stent was removed without difficulty, the patient tolerated the procedure well and voiced understanding of instructions that she may see some pink tinged urine, burning or mild discomfort with urination for a few voids after the procedure.  Was advised and voiced understanding of when to call the office.  Returned patients call at number provided to obtain additional information.  No answer, left a message for patient to call when she returns

## 2024-02-05 NOTE — Telephone Encounter (Signed)
 Returned patients call at number provided.  No answer, left a message for patient to call when she returns

## 2024-02-17 ENCOUNTER — Emergency Department (HOSPITAL_BASED_OUTPATIENT_CLINIC_OR_DEPARTMENT_OTHER)
Admission: EM | Admit: 2024-02-17 | Discharge: 2024-02-17 | Disposition: A | Discharge status: Home / Self Care | Attending: Emergency Medicine | Admitting: Emergency Medicine

## 2024-02-17 ENCOUNTER — Encounter (HOSPITAL_BASED_OUTPATIENT_CLINIC_OR_DEPARTMENT_OTHER): Payer: Self-pay

## 2024-02-17 ENCOUNTER — Other Ambulatory Visit: Payer: Self-pay

## 2024-02-17 ENCOUNTER — Emergency Department (HOSPITAL_BASED_OUTPATIENT_CLINIC_OR_DEPARTMENT_OTHER)

## 2024-02-17 DIAGNOSIS — R300 Dysuria: Secondary | ICD-10-CM | POA: Diagnosis not present

## 2024-02-17 DIAGNOSIS — R110 Nausea: Secondary | ICD-10-CM | POA: Diagnosis not present

## 2024-02-17 DIAGNOSIS — R319 Hematuria, unspecified: Secondary | ICD-10-CM | POA: Diagnosis not present

## 2024-02-17 DIAGNOSIS — R10A1 Flank pain, right side: Secondary | ICD-10-CM | POA: Diagnosis not present

## 2024-02-17 DIAGNOSIS — R109 Unspecified abdominal pain: Secondary | ICD-10-CM | POA: Diagnosis not present

## 2024-02-17 DIAGNOSIS — N184 Chronic kidney disease, stage 4 (severe): Secondary | ICD-10-CM | POA: Diagnosis not present

## 2024-02-17 LAB — CBC
HCT: 39.1 % (ref 36.0–46.0)
Hemoglobin: 13.2 g/dL (ref 12.0–15.0)
MCH: 31.1 pg (ref 26.0–34.0)
MCHC: 33.8 g/dL (ref 30.0–36.0)
MCV: 92 fL (ref 80.0–100.0)
Platelets: 266 K/uL (ref 150–400)
RBC: 4.25 MIL/uL (ref 3.87–5.11)
RDW: 13.2 % (ref 11.5–15.5)
WBC: 4.7 K/uL (ref 4.0–10.5)
nRBC: 0 % (ref 0.0–0.2)

## 2024-02-17 LAB — URINALYSIS, ROUTINE W REFLEX MICROSCOPIC
Bilirubin Urine: NEGATIVE
Glucose, UA: NEGATIVE mg/dL
Hgb urine dipstick: NEGATIVE
Ketones, ur: NEGATIVE mg/dL
Nitrite: NEGATIVE
Protein, ur: NEGATIVE mg/dL
Specific Gravity, Urine: 1.02 (ref 1.005–1.030)
pH: 5.5 (ref 5.0–8.0)

## 2024-02-17 LAB — URINALYSIS, MICROSCOPIC (REFLEX)

## 2024-02-17 LAB — COMPREHENSIVE METABOLIC PANEL WITH GFR
ALT: 10 U/L (ref 0–44)
AST: 17 U/L (ref 15–41)
Albumin: 4.5 g/dL (ref 3.5–5.0)
Alkaline Phosphatase: 66 U/L (ref 38–126)
Anion gap: 11 (ref 5–15)
BUN: 11 mg/dL (ref 6–20)
CO2: 25 mmol/L (ref 22–32)
Calcium: 9.4 mg/dL (ref 8.9–10.3)
Chloride: 104 mmol/L (ref 98–111)
Creatinine, Ser: 0.69 mg/dL (ref 0.44–1.00)
GFR, Estimated: >60 mL/min (ref >60)
Glucose, Bld: 88 mg/dL (ref 70–99)
Potassium: 4.2 mmol/L (ref 3.5–5.1)
Sodium: 140 mmol/L (ref 135–145)
Total Bilirubin: 0.5 mg/dL (ref 0.0–1.2)
Total Protein: 7.3 g/dL (ref 6.5–8.1)

## 2024-02-17 LAB — LIPASE, BLOOD: Lipase: 27 U/L (ref 11–51)

## 2024-02-17 LAB — PREGNANCY, URINE: Preg Test, Ur: NEGATIVE

## 2024-02-17 MED ORDER — LACTATED RINGERS IV BOLUS
1000.0000 mL | Freq: Once | INTRAVENOUS | Status: AC
  Administered 2024-02-17: 1000 mL via INTRAVENOUS

## 2024-02-17 MED ORDER — MORPHINE SULFATE (PF) 4 MG/ML IV SOLN
4.0000 mg | Freq: Once | INTRAVENOUS | Status: AC
  Administered 2024-02-17: 4 mg via INTRAVENOUS
  Filled 2024-02-17: qty 1

## 2024-02-17 MED ORDER — KETOROLAC TROMETHAMINE 30 MG/ML IJ SOLN
15.0000 mg | Freq: Once | INTRAMUSCULAR | Status: AC
  Administered 2024-02-17: 15 mg via INTRAVENOUS
  Filled 2024-02-17: qty 1

## 2024-02-17 MED ORDER — ONDANSETRON HCL 4 MG/2ML IJ SOLN
4.0000 mg | Freq: Once | INTRAMUSCULAR | Status: AC
  Administered 2024-02-17: 4 mg via INTRAVENOUS
  Filled 2024-02-17: qty 2

## 2024-02-17 NOTE — Discharge Instructions (Signed)
 Take Tylenol  and ibuprofen as discussed below. Follow-up with your urologist return the emergency room for any new or concerning symptom such as fever, worsening pain this should gradually improve with time.  I suspect that you passed a kidney stone.

## 2024-02-17 NOTE — ED Triage Notes (Signed)
 R flank pain since last night. Dysuria, nausea Denies fevers, emesis  Reports hx stage 4 kidney disease, was told had a kidney stone 1 month ago

## 2024-02-17 NOTE — ED Provider Notes (Signed)
 [facility] Provider Note   CSN: [id number] Arrival date & time: 02/17/24  1345     Patient presents with: Flank Pain   Jade Wolfe is a 18 y.o. female.    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  She denies any urinary frequency, urgency, dysuria but does endorse some hematuria at urgent care when she was seen there this morning.  She denies any fevers or chills no abdominal pain some mild nausea no vomiting  No chest pain or difficulty breathing.  No recent surgeries, hospitalization, long travel, hemoptysis, estrogen containing OCP, cancer history.  No unilateral leg swelling.  No history of PE or VTE.       Prior to Admission medications   Not on File    Allergies: Gluten meal    Review of Systems  Genitourinary:  Positive for flank pain.    Updated Vital Signs BP 110/68 (BP Location: Right Arm)   Pulse 60   Temp 98 F (36.7 C) (Oral)   Resp 16   LMP 02/09/2024 (Exact Date)   SpO2 100%   Physical Exam Vitals and nursing note reviewed.  Constitutional:      General: She is not in acute distress. HENT:     Head: Normocephalic and atraumatic.     Nose: Nose normal.     Mouth/Throat:     Mouth: Mucous membranes are moist.  Eyes:     General: No scleral icterus. Cardiovascular:     Rate and Rhythm: Normal rate and regular rhythm.     Pulses: Normal pulses.     Heart sounds: Normal heart sounds.  Pulmonary:     Effort: Pulmonary effort is normal. No respiratory distress.     Breath sounds: No  wheezing.  Abdominal:     Palpations: Abdomen is soft.     Tenderness: There is no abdominal tenderness. There is no guarding or rebound.  Musculoskeletal:     Cervical back: Normal range of motion.     Right lower leg: No edema.     Left lower leg: No edema.  Skin:    General: Skin is warm and dry.     Capillary Refill: Capillary refill takes less than 2 seconds.  Neurological:     Mental Status: She is alert. Mental status is at baseline.  Psychiatric:        Mood and Affect: Mood normal.        Behavior: Behavior normal.     (all labs ordered are listed, but only abnormal results are displayed) Labs Reviewed  URINALYSIS, ROUTINE W REFLEX MICROSCOPIC - Abnormal; Notable for the following components:      Result Value   APPearance HAZY (*)    Leukocytes,Ua TRACE (*)    All other components within normal limits  URINALYSIS, MICROSCOPIC (REFLEX) - Abnormal; Notable for the following components:   Bacteria, UA FEW (*)    All other components within normal limits  PREGNANCY, URINE  LIPASE, BLOOD  COMPREHENSIVE METABOLIC PANEL WITH GFR  CBC    EKG:  None  Radiology: CT Renal Stone Study Result Date: 02/17/2024 CLINICAL DATA:  Right flank pain since last night, dysuria and nausea EXAM: CT ABDOMEN AND PELVIS WITHOUT CONTRAST TECHNIQUE: Multidetector CT imaging of the abdomen and pelvis was performed following the standard protocol without IV contrast. RADIATION DOSE REDUCTION: This exam was performed according to the departmental dose-optimization program which includes automated exposure control, adjustment of the mA and/or kV according to patient size and/or use of iterative reconstruction technique. COMPARISON:  06/11/2023, 02/22/2023, 12/12/2012 FINDINGS: Lower chest: No acute pleural or parenchymal lung disease. Hepatobiliary: Unremarkable unenhanced appearance of the liver and gallbladder. No biliary duct dilation. Pancreas: Unremarkable unenhanced appearance. Spleen:  Unremarkable unenhanced appearance. Adrenals/Urinary Tract: Chronic right renal atrophy and scarring within the upper pole right kidney. Unremarkable unenhanced appearance of the left kidney. No urinary tract calculi or obstructive uropathy within either kidney. The adrenals are stable. The bladder is incompletely distended, limiting its evaluation. Stomach/Bowel: No bowel obstruction or ileus. Normal retrocecal appendix. No bowel wall thickening or inflammatory change. Vascular/Lymphatic: No significant vascular findings are present. No enlarged abdominal or pelvic lymph nodes. Reproductive: Uterus and bilateral adnexa are unremarkable. Other: No free fluid or free intraperitoneal gas. No abdominal wall hernia. Musculoskeletal: No acute or destructive bony abnormalities. Reconstructed images demonstrate no additional findings. IMPRESSION: 1. Stable right renal atrophy and cortical scarring within the upper pole right kidney. 2. Otherwise unremarkable unenhanced CT of the abdomen and pelvis. Electronically Signed   By: Ozell Daring M.D.   On: 02/17/2024 15:23     Procedures   Medications Ordered in the ED  lactated ringers bolus 1,000 mL (0 mLs Intravenous Stopped 02/17/24 1607)  morphine (PF) 4 MG/ML injection 4 mg (4 mg Intravenous Given 02/17/24 1453)  ondansetron  (ZOFRAN ) injection 4 mg (4 mg Intravenous Given 02/17/24 1451)  ketorolac  (TORADOL ) 30 MG/ML injection 15 mg (15 mg Intravenous Given 02/17/24 1701)                                    Medical Decision Making Amount and/or Complexity of Data Reviewed Labs: ordered. Radiology: ordered.  Risk Prescription drug management.   This patient presents to the ED for concern of flank pain, this involves a number of treatment options, and is a complaint that carries with it a moderate to high risk of complications and morbidity. A differential diagnosis was considered for the patient's symptoms which is discussed below:   The  differential diagnosis of emergent flank pain includes, but is not limited to :Abdominal aortic aneurysm,, Renal artery embolism,Renal vein thrombosis, Aortic dissection, Mesenteric ischemia, Pyelonephritis, Renal infarction, Renal hemorrhage, Nephrolithiasis/ Renal Colic, Bladder tumor,Cystitis, Biliary colic, Pancreatitis Perforated peptic ulcer Appendicitis ,Inguinal Hernia, Diverticulitis, Bowel obstruction Ectopic Pregnancy,PID/TOA,Ovarian cyst, Ovarian torsion) Shingles Lower lobe pneumonia, Retroperitoneal hematoma/abscess/tumor, Epidural abscess, Epidural hematoma    Co morbidities: Discussed in HPI   Brief History:  Patient is a 18 year old female with past medical history significant for 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  She denies any urinary frequency, urgency, dysuria but does endorse some hematuria at urgent care when she  was seen there this morning.  She denies any fevers or chills no abdominal pain some mild nausea no vomiting  No chest pain or difficulty breathing.  No recent surgeries, hospitalization, long travel, hemoptysis, estrogen containing OCP, cancer history.  No unilateral leg swelling.  No history of PE or VTE.     EMR reviewed including pt PMHx, past surgical history and past visits to ER.   See HPI for more details   Lab Tests:   I personally reviewed all laboratory work and imaging. Metabolic panel without any acute abnormality specifically kidney function within normal limits and no significant electrolyte abnormalities. CBC without leukocytosis or significant anemia.   Imaging Studies:  NAD. I personally reviewed  all imaging studies and no acute abnormality found. I agree with radiology interpretation.  Does have some pelviectasis which is been present on prior CTs and ultrasounds  Cardiac Monitoring:  The patient was maintained on a cardiac monitor.  I personally viewed and interpreted the cardiac monitored which showed an underlying rhythm of: NSR NA   Medicines ordered:  I ordered medication including Toradol , LR, morphine, Zofran  for pain nausea dehydration Reevaluation of the patient after these medicines showed that the patient resolved I have reviewed the patients home medicines and have made adjustments as needed   Critical Interventions:     Consults/Attending Physician      Reevaluation:  After the interventions noted above I re-evaluated patient and found that they have :resolved   Social Determinants of Health:      Problem List / ED Course:  Patient is a 18 year old female present emergency room today with right flank pain no urinary symptoms.  Did have hematuria at urgent care and was sent to emergency room per my chart review it seems that she has a had a 3 mm stone in her right kidney as recently as several months ago now with no kidney stone I do wonder if she may have passed her kidney stone and be suffering from some residual ureteral spasms.  She is tolerating p.o., well-appearing will discharge home with recommendation is a Tylenol  ibuprofen and follow-up with primary care.   Dispostion:  After consideration of the diagnostic results and the patients response to treatment, I feel that the patent would benefit from outpatient follow-up   Final diagnoses:  Right flank pain    ED Discharge Orders     None          Neldon Hamp RAMAN, [place] 02/17/24 2136    Franklyn Sid SAILOR, MD 02/18/24 [phone]

## 2024-02-29 ENCOUNTER — Encounter (HOSPITAL_BASED_OUTPATIENT_CLINIC_OR_DEPARTMENT_OTHER): Payer: Self-pay | Admitting: Emergency Medicine

## 2024-02-29 ENCOUNTER — Emergency Department (HOSPITAL_BASED_OUTPATIENT_CLINIC_OR_DEPARTMENT_OTHER)

## 2024-02-29 ENCOUNTER — Emergency Department (HOSPITAL_BASED_OUTPATIENT_CLINIC_OR_DEPARTMENT_OTHER)
Admission: EM | Admit: 2024-02-29 | Discharge: 2024-02-29 | Disposition: A | Discharge status: Home / Self Care | Attending: Emergency Medicine | Admitting: Emergency Medicine

## 2024-02-29 DIAGNOSIS — N3000 Acute cystitis without hematuria: Secondary | ICD-10-CM | POA: Diagnosis not present

## 2024-02-29 DIAGNOSIS — N189 Chronic kidney disease, unspecified: Secondary | ICD-10-CM | POA: Diagnosis not present

## 2024-02-29 DIAGNOSIS — R109 Unspecified abdominal pain: Secondary | ICD-10-CM | POA: Diagnosis not present

## 2024-02-29 DIAGNOSIS — R10A1 Flank pain, right side: Secondary | ICD-10-CM | POA: Diagnosis not present

## 2024-02-29 LAB — COMPREHENSIVE METABOLIC PANEL WITH GFR
ALT: 8 U/L (ref 0–44)
AST: 18 U/L (ref 15–41)
Albumin: 4.5 g/dL (ref 3.5–5.0)
Alkaline Phosphatase: 63 U/L (ref 38–126)
Anion gap: 10 (ref 5–15)
BUN: 12 mg/dL (ref 6–20)
CO2: 22 mmol/L (ref 22–32)
Calcium: 9.2 mg/dL (ref 8.9–10.3)
Chloride: 106 mmol/L (ref 98–111)
Creatinine, Ser: 0.66 mg/dL (ref 0.44–1.00)
GFR, Estimated: >60 mL/min (ref >60)
Glucose, Bld: 94 mg/dL (ref 70–99)
Potassium: 4.4 mmol/L (ref 3.5–5.1)
Sodium: 138 mmol/L (ref 135–145)
Total Bilirubin: 0.3 mg/dL (ref 0.0–1.2)
Total Protein: 7 g/dL (ref 6.5–8.1)

## 2024-02-29 LAB — CBC WITH DIFFERENTIAL/PLATELET
Abs Immature Granulocytes: 0.01 K/uL (ref 0.00–0.07)
Basophils Absolute: 0 K/uL (ref 0.0–0.1)
Basophils Relative: 1 %
Eosinophils Absolute: 0 K/uL (ref 0.0–0.5)
Eosinophils Relative: 1 %
HCT: 37.3 % (ref 36.0–46.0)
Hemoglobin: 12.5 g/dL (ref 12.0–15.0)
Immature Granulocytes: 0 %
Lymphocytes Relative: 37 %
Lymphs Abs: 1.6 K/uL (ref 0.7–4.0)
MCH: 30.6 pg (ref 26.0–34.0)
MCHC: 33.5 g/dL (ref 30.0–36.0)
MCV: 91.2 fL (ref 80.0–100.0)
Monocytes Absolute: 0.5 K/uL (ref 0.1–1.0)
Monocytes Relative: 11 %
Neutro Abs: 2.2 K/uL (ref 1.7–7.7)
Neutrophils Relative %: 50 %
Platelets: 248 K/uL (ref 150–400)
RBC: 4.09 MIL/uL (ref 3.87–5.11)
RDW: 13.2 % (ref 11.5–15.5)
WBC: 4.3 K/uL (ref 4.0–10.5)
nRBC: 0 % (ref 0.0–0.2)

## 2024-02-29 LAB — URINALYSIS, MICROSCOPIC (REFLEX): RBC / HPF: NONE SEEN RBC/hpf (ref 0–5)

## 2024-02-29 LAB — URINALYSIS, ROUTINE W REFLEX MICROSCOPIC
Bilirubin Urine: NEGATIVE
Glucose, UA: NEGATIVE mg/dL
Hgb urine dipstick: NEGATIVE
Ketones, ur: NEGATIVE mg/dL
Nitrite: NEGATIVE
Protein, ur: NEGATIVE mg/dL
Specific Gravity, Urine: 1.025 (ref 1.005–1.030)
pH: 6.5 (ref 5.0–8.0)

## 2024-02-29 LAB — LIPASE, BLOOD: Lipase: 31 U/L (ref 11–51)

## 2024-02-29 LAB — PREGNANCY, URINE: Preg Test, Ur: NEGATIVE

## 2024-02-29 MED ORDER — SODIUM CHLORIDE 0.9 % IV SOLN
1.0000 g | Freq: Once | INTRAVENOUS | Status: AC
  Administered 2024-02-29: 1 g via INTRAVENOUS
  Filled 2024-02-29: qty 10

## 2024-02-29 MED ORDER — SODIUM CHLORIDE 0.9 % IV SOLN: INTRAVENOUS | Status: DC | PRN

## 2024-02-29 MED ORDER — MORPHINE SULFATE (PF) 4 MG/ML IV SOLN
4.0000 mg | Freq: Once | INTRAVENOUS | Status: AC
  Administered 2024-02-29: 4 mg via INTRAVENOUS
  Filled 2024-02-29: qty 1

## 2024-02-29 MED ORDER — CEFADROXIL 500 MG PO CAPS: 500.0000 mg | ORAL_CAPSULE | Freq: Two times a day (BID) | ORAL | 0 refills | Status: AC

## 2024-02-29 MED ORDER — ONDANSETRON HCL 4 MG/2ML IJ SOLN
4.0000 mg | Freq: Once | INTRAMUSCULAR | Status: AC
Start: 2024-02-29 — End: 2024-02-29
  Administered 2024-02-29: 4 mg via INTRAVENOUS
  Filled 2024-02-29: qty 2

## 2024-02-29 NOTE — ED Triage Notes (Signed)
 Pt c/o RT flank and RT lower back pain, endorses recently passing kidney stone.. 200 mg ibuprofen 1 hr pta with no relief

## 2024-02-29 NOTE — ED Notes (Signed)
 Spoke with lab to add on urine culture.

## 2024-02-29 NOTE — ED Provider Notes (Signed)
 [facility] Provider Note   CSN: [id number] Arrival date & time: 02/29/24  1123     Patient presents with: Flank Pain   Jade Wolfe is a 18 y.o. female.  Patient with past history significant for celiac disease, generalized abdominal pain, kidney disease presents to the emergency department with concerns of flank pain.  Reports that she began to experience right-sided flank pain this morning with lower back pain.  Has not had any nausea or vomiting and states that she took some ibuprofen prior to arriving.  She endorses a history of renal disease and follows closely with urology.  No reported gross hematuria, fever, chills, or bodyaches.  She currently rates her pain at a 7 out of 10.    Flank Pain       Prior to Admission medications   Medication Sig Start Date End Date Taking? Authorizing Provider  cefadroxil  (DURICEF) 500 MG capsule Take 1 capsule (500 mg total) by mouth 2 (two) times daily for 7 days. 02/29/24 03/07/24 Yes Terriona Horlacher A, PA-C    Allergies: Gluten meal    Review of Systems  Genitourinary:  Positive for flank pain.  All other systems reviewed and are negative.   Updated Vital Signs BP 118/79   Pulse 76   Temp 98.2 F (36.8 C) (Oral)   Resp 16   Wt 54.4 kg   LMP 02/09/2024 (Exact Date)   SpO2 100%   BMI 21.95 kg/m   Physical Exam Vitals and nursing note reviewed.  Constitutional:      General: She is not in acute distress.    Appearance: She is well-developed.  HENT:     Head: Normocephalic and atraumatic.  Eyes:     Conjunctiva/sclera: Conjunctivae normal.  Cardiovascular:     Rate and Rhythm: Normal rate and regular rhythm.     Heart sounds: No murmur heard. Pulmonary:     Effort: Pulmonary effort is normal. No respiratory distress.     Breath sounds: No wheezing or rales.  Abdominal:     General: Abdomen is flat. Bowel sounds are normal. There is no distension.     Palpations:  Abdomen is soft.     Tenderness: There is no abdominal tenderness. There is right CVA tenderness. There is no left CVA tenderness or guarding.  Musculoskeletal:        General: No swelling.     Cervical back: Neck supple.  Skin:    General: Skin is warm and dry.     Capillary Refill: Capillary refill takes less than 2 seconds.  Neurological:     Mental Status: She is alert.  Psychiatric:        Mood and Affect: Mood normal.     (all labs ordered are listed, but only abnormal results are displayed) Labs Reviewed  URINALYSIS, ROUTINE W REFLEX MICROSCOPIC - Abnormal; Notable for the following components:      Result Value   Leukocytes,Ua SMALL (*)    All other components within normal limits  URINALYSIS, MICROSCOPIC (REFLEX) - Abnormal; Notable for the following components:   Bacteria, UA MANY (*)    All other components within normal limits  URINE CULTURE  PREGNANCY, URINE  CBC WITH DIFFERENTIAL/PLATELET  COMPREHENSIVE METABOLIC PANEL WITH GFR  LIPASE, BLOOD    EKG: None  Radiology: CT Renal Stone Study Result Date: 02/29/2024 CLINICAL DATA:  Abdominal/flank pain.  Concern for kidney stone. EXAM: CT ABDOMEN AND PELVIS WITHOUT CONTRAST TECHNIQUE: Multidetector CT  imaging of the abdomen and pelvis was performed following the standard protocol without IV contrast. RADIATION DOSE REDUCTION: This exam was performed according to the departmental dose-optimization program which includes automated exposure control, adjustment of the mA and/or kV according to patient size and/or use of iterative reconstruction technique. COMPARISON:  CT abdomen pelvis dated 02/17/2024. FINDINGS: Evaluation of this exam is limited in the absence of intravenous contrast. Lower chest: The visualized lung bases are clear. No intra-abdominal free air or free fluid. Hepatobiliary: No focal liver abnormality is seen. No gallstones, gallbladder wall thickening, or biliary dilatation. Pancreas: Unremarkable. No  pancreatic ductal dilatation or surrounding inflammatory changes. Spleen: Normal in size without focal abnormality. Adrenals/Urinary Tract: The adrenal glands are unremarkable. There is mild right renal parenchyma atrophy. There is no hydronephrosis or nephrolithiasis on either side. The visualized ureters and urinary bladder appear unremarkable. Stomach/Bowel: There is moderate stool throughout the colon. There is no bowel obstruction or active inflammation. The appendix is normal. Vascular/Lymphatic: The abdominal aorta and IVC are grossly unremarkable on this noncontrast CT. No portal venous gas. There is no adenopathy. Reproductive: The uterus is grossly unremarkable. No suspicious adnexal masses. Other: None Musculoskeletal: No acute or significant osseous findings. IMPRESSION: 1. No acute intra-abdominal or pelvic pathology. No hydronephrosis or nephrolithiasis. 2. Moderate colonic stool burden. No bowel obstruction. Normal appendix. Electronically Signed   By: Vanetta Chou M.D.   On: 02/29/2024 13:50     Procedures   Medications Ordered in the ED  0.9 %  sodium chloride  infusion (0 mLs Intravenous Stopped 02/29/24 1531)  morphine  (PF) 4 MG/ML injection 4 mg (4 mg Intravenous Given 02/29/24 1255)  ondansetron  (ZOFRAN ) injection 4 mg (4 mg Intravenous Given 02/29/24 1255)  cefTRIAXone  (ROCEPHIN ) 1 g in sodium chloride  0.9 % 100 mL IVPB (0 g Intravenous Stopped 02/29/24 1531)                                    Medical Decision Making Amount and/or Complexity of Data Reviewed Labs: ordered. Radiology: ordered.  Risk Prescription drug management.   This patient presents to the ED for concern of flank pain, this involves an extensive number of treatment options, and is a complaint that carries with it a high risk of complications and morbidity.  The differential diagnosis includes UTI, pyelonephritis, urolithiasis, hydronephrosis   Co morbidities that complicate the patient  evaluation  Renal disease, celiac disease, generalized abdominal pain   Additional history obtained:  Additional history obtained from chart review   Lab Tests:  I Ordered, and personally interpreted labs.  The pertinent results include: CBC unremarkable, CMP with no signs of renal dysfunction, UA concerning for infection with large bacteria and small leukocytes seen but no nitrates, lipase negative, urine pregnancy negative, urine culture pending   Imaging Studies ordered:  I ordered imaging studies including CT renal stone study I independently visualized and interpreted imaging which showed no acute intra-abdominal or pelvic pathology. No hydronephrosis or nephrolithiasis. 2. Moderate colonic stool burden. No bowel obstruction. Normal appendix. I agree with the radiologist interpretation   Consultations Obtained:  I requested consultation with none,  and discussed lab and imaging findings as well as pertinent plan - they recommend: N/A   Problem List / ED Course / Critical interventions / Medication management  Patient with past history significant for renal disease, celiac disease, generalized abdominal pain presents the ED with concerns of flank pain.  Reports onset of flank pain this morning on the right side.  States history of renal disease to the right kidney.  She denies any gross hematuria.  Reports being seen about 1 month ago for similar concerns and was advised that she likely had passed a kidney stone at that time as no stone was seen on imaging.  At this time, she denies any fever, chills, or bodyaches. Physical exam is significant for right-sided CVA tenderness.  No abdominal tenderness.  Normal bowel sounds.  No abnormal heart or lung sounds. Patient's workup is largely reassuring with no leukocytosis.  UA with likely developing infection with bacteria and leukocytes seen but no nitrites.  Will send for urine culture for further testing.  Lipase negative.  Pregnancy  negative.  CMP with no signs of renal dysfunction. CT renal stone study is negative for stone.  No signs of stranding around the kidneys to suggest pyelonephritis.  Suspect CVA tenderness is likely referred pain from cystitis.  Given patient's complex renal history, will administer an additional dose of IV antibiotics here.  Will discharge patient home with a prescription for cefadroxil  to be taken twice daily for the next week.  Return precautions discussed.  Advised patient to follow-up closely with urology in 2 reach out to PCP for referral for nephrology follow-up per her own personal request. 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  Final diagnoses:  Acute cystitis without hematuria    ED Discharge Orders          Ordered    cefadroxil  (DURICEF) 500 MG capsule  2 times daily        02/29/24 1438               Dottie Vaquerano A, PA-C 02/29/24 1531    Mannie Pac T, DO 03/09/24 [phone]

## 2024-02-29 NOTE — ED Notes (Signed)
 Lab called for additional urine culture

## 2024-02-29 NOTE — Discharge Instructions (Addendum)
 You were seen in the emergency department today for concerns of flank pain.  Your labs and imaging appear to show no signs of a kidney stone and you do appear to have a UTI that has developed.  You were started on antibiotics today.  A urine culture was sent for further evaluation of this infection and antibiotics may be changed as needed.  For any concerns of new or worsening symptoms, return to the emergency department.  Otherwise, please follow-up closely with your urologist and consider following up with a nephrologist which your primary care provider can refer you to.

## 2024-03-01 LAB — URINE CULTURE: Culture: NO GROWTH

## 2024-03-02 ENCOUNTER — Encounter: Payer: Self-pay | Admitting: Family Medicine

## 2024-03-02 ENCOUNTER — Ambulatory Visit: Admitting: Family Medicine

## 2024-03-02 VITALS — BP 116/70 | HR 100 | Temp 98.0°F | Resp 16 | Ht 62.0 in | Wt 122.8 lb

## 2024-03-02 DIAGNOSIS — Q625 Duplication of ureter: Secondary | ICD-10-CM

## 2024-03-02 DIAGNOSIS — Z23 Encounter for immunization: Secondary | ICD-10-CM | POA: Diagnosis not present

## 2024-03-02 NOTE — Patient Instructions (Signed)
 If you do not hear anything about your referral in the next 1-2 weeks, call our office and ask for an update.  Try to drink 55-60 oz of water daily outside of exercise.  Let us  know if you need anything.

## 2024-03-02 NOTE — Progress Notes (Signed)
 Chief Complaint  Patient presents with   Referral    Discuss Referral     Subjective: Patient is a 18 y.o. female here for possible referral.  Patient has a history of a duplicated right ureter.  She has been seeing urology but feels that she is being blown off.:  Urology referred to [facility] due to the complexity of the issue.  She has had a couple kidney stones and most recently pyelonephritis.  She is currently taking Duricef twice daily reports symptoms are improved from several days ago.  She does not stay particularly well-hydrated, has not been urinating as much.  No blood in the urine or frothy nature.  GFR has been unremarkable at her ER visits recently.  She is requesting a referral to [facility] nephrology.  Past Medical History:  Diagnosis Date   Celiac disease    Renal disorder     Objective: BP 116/70 (BP Location: Left Arm, Patient Position: Sitting)   Pulse 100   Temp 98 F (36.7 C) (Oral)   Resp 16   Ht 5' 2 (1.575 m)   Wt 122 lb 12.8 oz (55.7 kg)   LMP 02/09/2024 (Exact Date)   SpO2 98%   BMI 22.46 kg/m  General: Awake, appears stated age Heart: RRR, no LE edema Lungs: CTAB, no rales, wheezes or rhonchi. No accessory muscle use Abdomen: Bowel sounds present, soft, nontender, nondistended Mouth: No mucosal lesions, membranes are dry Psych: Age appropriate judgment and insight, normal affect and mood  Assessment and Plan: Duplicated ureter, right - Plan: Ambulatory referral to Nephrology  Need for influenza vaccination - Plan: Flu vaccine trivalent PF, 6mos and older(Flulaval,Afluria,Fluarix,Fluzone)  Flu shot today.  Refer to [facility] nephrology at her request.  Needs to push fluids.  No need to repeat labs given clinical improvement and relatively normal labs in the ER. The patient voiced understanding and agreement to the plan.  Mabel [place], DO 03/02/24  10:52 AM

## 2024-03-10 ENCOUNTER — Other Ambulatory Visit: Payer: Self-pay

## 2024-03-10 ENCOUNTER — Emergency Department (HOSPITAL_COMMUNITY)
Admission: EM | Admit: 2024-03-10 | Discharge: 2024-03-10 | Disposition: A | Discharge status: Home / Self Care | Attending: Emergency Medicine | Admitting: Emergency Medicine

## 2024-03-10 ENCOUNTER — Encounter (HOSPITAL_COMMUNITY): Payer: Self-pay

## 2024-03-10 ENCOUNTER — Ambulatory Visit: Payer: Self-pay

## 2024-03-10 DIAGNOSIS — R319 Hematuria, unspecified: Secondary | ICD-10-CM | POA: Diagnosis not present

## 2024-03-10 LAB — URINALYSIS, ROUTINE W REFLEX MICROSCOPIC
Bilirubin Urine: NEGATIVE
Glucose, UA: NEGATIVE mg/dL
Ketones, ur: NEGATIVE mg/dL
Leukocytes,Ua: NEGATIVE
Nitrite: NEGATIVE
Protein, ur: NEGATIVE mg/dL
Specific Gravity, Urine: 1.003 — ABNORMAL LOW (ref 1.005–1.030)
pH: 6 (ref 5.0–8.0)

## 2024-03-10 LAB — HCG, SERUM, QUALITATIVE: Preg, Serum: NEGATIVE

## 2024-03-10 LAB — BASIC METABOLIC PANEL WITH GFR
Anion gap: 10 (ref 5–15)
BUN: 9 mg/dL (ref 6–20)
CO2: 23 mmol/L (ref 22–32)
Calcium: 9.4 mg/dL (ref 8.9–10.3)
Chloride: 106 mmol/L (ref 98–111)
Creatinine, Ser: 0.61 mg/dL (ref 0.44–1.00)
GFR, Estimated: >60 mL/min (ref >60)
Glucose, Bld: 93 mg/dL (ref 70–99)
Potassium: 4 mmol/L (ref 3.5–5.1)
Sodium: 138 mmol/L (ref 135–145)

## 2024-03-10 LAB — CBC
HCT: 38.1 % (ref 36.0–46.0)
Hemoglobin: 13 g/dL (ref 12.0–15.0)
MCH: 31.3 pg (ref 26.0–34.0)
MCHC: 34.1 g/dL (ref 30.0–36.0)
MCV: 91.8 fL (ref 80.0–100.0)
Platelets: 239 K/uL (ref 150–400)
RBC: 4.15 MIL/uL (ref 3.87–5.11)
RDW: 13.2 % (ref 11.5–15.5)
WBC: 5.5 K/uL (ref 4.0–10.5)
nRBC: 0 % (ref 0.0–0.2)

## 2024-03-10 MED ORDER — KETOROLAC TROMETHAMINE 15 MG/ML IJ SOLN
7.5000 mg | Freq: Once | INTRAMUSCULAR | Status: AC
  Administered 2024-03-10: 7.5 mg via INTRAVENOUS
  Filled 2024-03-10: qty 1

## 2024-03-10 NOTE — Discharge Instructions (Signed)
 Like we discussed, I think there is a chance that you could have some interstitial cystitis given your family history.  You should discuss this with your urologist.  You may take Azo over-the-counter for the next 2 days.  You will be called within 48 hours if your urine culture grows anything.  Follow-up with your primary care doctor.

## 2024-03-10 NOTE — ED Notes (Signed)
Pt has a urine cup and aware sample is needed

## 2024-03-10 NOTE — ED Triage Notes (Signed)
 Patient has stage 4 kidney disease on right kidney. Stated she has right flank pain, and is worried she has another kidney infection. Completed antibiotics. And said when she urinated today she had a blood clot in her urine. Has burning urination.

## 2024-03-10 NOTE — ED Notes (Signed)
 Patient alert and oriented x 4. Airway patent, respirations even and unlabored. Skin normal, warm and dry. Discharge instructions discussed with patient and patient's family. Patient has no questions at this time. Patient has safe ride home.

## 2024-03-10 NOTE — ED Provider Notes (Signed)
 [place] [facility] AT [facility] Provider Note  CSN: [id number] Arrival date & time: 03/10/24 1149  Chief Complaint(s) Flank Pain  HPI Jade Wolfe is a 18 y.o. female who is here today with hematuria.  Patient has a history of ureter reflux as a child, had surgery to repair.  She states that she was recently treated for UTI, I reviewed the patient's culture which showed no growth, she states that she started to have some blood in her urine today.   Past Medical History Past Medical History:  Diagnosis Date   Celiac disease    Renal disorder    Patient Active Problem List   Diagnosis Date Noted   Obsessive-compulsive disorder 06/28/2022   GAD (generalized anxiety disorder) 06/28/2022   Diarrhea 12/03/2012   Generalized abdominal pain 12/03/2012   Celiac disease    Home Medication(s) Prior to Admission medications   Not on File                                                                                                                                    Past Surgical History Past Surgical History:  Procedure Laterality Date   CYSTOSCOPY DEFLUX PROCEDURE     Family History Family History  Problem Relation Age of Onset   Fibromyalgia Mother    Headache Mother    Hyperlipidemia Father    Diabetes Paternal Grandfather    Celiac disease Neg Hx    Heart disease Neg Hx    Cancer Neg Hx     Social History Social History   Tobacco Use   Smoking status: Never   Smokeless tobacco: Never  Vaping Use   Vaping status: Never Used  Substance Use Topics   Alcohol use: No   Drug use: No   Allergies Gluten meal  Review of Systems Review of Systems  Physical Exam Vital Signs  I have reviewed the triage vital signs BP 136/84   Pulse 97   Temp 97.9 F (36.6 C) (Oral)   Resp 17   Ht 5' 2 (1.575 m)   Wt 56.7 kg   LMP 02/09/2024 (Exact Date)   SpO2 100%   BMI 22.86 kg/m   Physical Exam Vitals and nursing note reviewed. Exam  conducted with a chaperone present.  Cardiovascular:     Rate and Rhythm: Normal rate.  Pulmonary:     Effort: Pulmonary effort is normal.  Abdominal:     General: Abdomen is flat. There is no distension.     Palpations: Abdomen is soft.     Tenderness: There is no abdominal tenderness. There is no guarding.  Genitourinary:    Comments: Your chaperone present.  There was some blood at the urethral meatus. Patient has not had sexual intercourse previously so is apprehension about full speculum exam.  I was able to spread labia majora and minora, was able to visualize external vagina, did not appreciate any  active bleeding or dried blood. Musculoskeletal:        General: Normal range of motion.  Skin:    General: Skin is warm.  Neurological:     General: No focal deficit present.     Mental Status: She is alert.     ED Results and Treatments Labs (all labs ordered are listed, but only abnormal results are displayed) Labs Reviewed  URINALYSIS, ROUTINE W REFLEX MICROSCOPIC - Abnormal; Notable for the following components:      Result Value   Color, Urine STRAW (*)    Specific Gravity, Urine 1.003 (*)    Hgb urine dipstick LARGE (*)    Bacteria, UA RARE (*)    All other components within normal limits  HCG, SERUM, QUALITATIVE  CBC  BASIC METABOLIC PANEL WITH GFR                                                                                                                          Radiology No results found.  Pertinent labs & imaging results that were available during my care of the patient were reviewed by me and considered in my medical decision making (see MDM for details).  Medications Ordered in ED Medications - No data to display                                                                                                                                   Procedures Procedures  (including critical care time)  Medical Decision Making / ED Course   This patient  presents to the ED for concern of hematuria, this involves an extensive number of treatment options, and is a complaint that carries with it a high risk of complications and morbidity.  The differential diagnosis includes cystitis, interstitial cystitis, consider, nephritis, consider nephrolithiasis.  MDM:  Reviewed the patient's recent urine culture which not show any growth.  Reviewed the recent CT image which does not show any nephrolithiasis.  Performed pelvic exam which showed some blood at the urethral radius, no blood identified in the vagina.  She has no lower abdominal tenderness, pregnancy test is negative.  No flank pain.  Do not believe the patient has large stone as she recently had a CT scan which was negative.  I am reluctant to start the patient on antibiotics due to her recent negative culture.  Patient's mother does have a history of interstitial  cystitis, certainly could be possible.  Discussed the patient how at this time it was not clear that she had a urinary tract infection, but we would follow culture.  Discussed why wished to hold off on CT imaging given the risk of radiation the patient was agreeable with this.  She is a follow-up appointment with urologist in 2 weeks.  Advised patient on taking Azo over-the-counter for the next 2 days.  Will discharge patient have her follow-up with her urologist.   Additional history obtained: -Additional history obtained from mother -External records from outside source obtained and reviewed including: Chart review including previous notes, labs, imaging, consultation notes   Lab Tests: -I ordered, reviewed, and interpreted labs.   The pertinent results include:   Labs Reviewed  URINALYSIS, ROUTINE W REFLEX MICROSCOPIC - Abnormal; Notable for the following components:      Result Value   Color, Urine STRAW (*)    Specific Gravity, Urine 1.003 (*)    Hgb urine dipstick LARGE (*)    Bacteria, UA RARE (*)    All other  components within normal limits  HCG, SERUM, QUALITATIVE  CBC  BASIC METABOLIC PANEL WITH GFR        Medicines ordered and prescription drug management: No orders of the defined types were placed in this encounter.   -I have reviewed the patients home medicines and have made adjustments as needed   Cardiac Monitoring: The patient was maintained on a cardiac monitor.  I personally viewed and interpreted the cardiac monitored which showed an underlying rhythm of: Normal sinus rhythm  Social Determinants of Health:  Factors impacting patients care include: Lack of access to primary care   Reevaluation: After the interventions noted above, I reevaluated the patient and found that they have :improved  Co morbidities that complicate the patient evaluation  Past Medical History:  Diagnosis Date   Celiac disease    Renal disorder       Dispostion: I considered admission for this patient, however with the reassuring workup she is appropriate for discharge.     Final Clinical Impression(s) / ED Diagnoses Final diagnoses:  Hematuria, unspecified type     @PCDICTATION @    Mannie Pac T, DO 03/10/24 1433

## 2024-03-10 NOTE — Telephone Encounter (Signed)
 FYI Only or Action Required?: FYI only for provider: ED advised.  Patient was last seen in primary care on 03/02/2024 by Frann Mabel Mt, DO.  Called Nurse Triage reporting Flank Pain and Hematuria.  Symptoms began today.  Interventions attempted: Nothing.  Symptoms are: gradually worsening.  Triage Disposition: Go to ED Now (Notify PCP)  Patient/caregiver understands and will follow disposition?: Yes Reason for Disposition  Passing pure blood or large blood clots (i.e., size > a dime)  (Exception: Fleck or small strands.)  Answer Assessment - Initial Assessment Questions 1. COLOR of URINE: Describe the color of the urine.  (e.g., tea-colored, pink, red, bloody) Do you have blood clots in your urine? (e.g., none, pea, grape, small coin)     Patient states urine was pink this morning and she noted a red chunk in the toilet, then when she went a second time at work she saw just straight blood coming out  2. ONSET: When did the bleeding start?      Today  3. EPISODES: How many times has there been blood in the urine? or How many times today?     2  4. PAIN with URINATION: Is there any pain with passing your urine? If Yes, ask: How bad is the pain?  (Scale 1-10; or mild, moderate, severe)     Burning after urination  5. FEVER: Do you have a fever? If Yes, ask: What is your temperature, how was it measured, and when did it start?     No  6. ASSOCIATED SYMPTOMS: Are you passing urine more frequently than usual?     No; I've been going less frequently over the last few days.  7. OTHER SYMPTOMS: Do you have any other symptoms? (e.g., back/flank pain, abdomen pain, vomiting)     Right flank pain 8/10  8. PREGNANCY: Is there any chance you are pregnant? When was your last menstrual period?    No  Protocols used: Urine - Blood In-A-AH  Copied from CRM #[id number]. Topic: Clinical - Red Word Triage >> Mar 10, 2024 11:10 AM Ashley R wrote: Red Word  that prompted transfer to Nurse Triage: right side hurts, pink urine, burning when urinating, awaiting scheduling for nephrology

## 2024-03-10 NOTE — Telephone Encounter (Signed)
 Pt made it to ER.

## 2024-03-11 LAB — GC/CHLAMYDIA PROBE AMP (~~LOC~~) NOT AT ARMC
Chlamydia: NEGATIVE
Comment: NEGATIVE
Comment: NORMAL
Neisseria Gonorrhea: NEGATIVE

## 2024-03-19 DIAGNOSIS — K900 Celiac disease: Secondary | ICD-10-CM | POA: Diagnosis not present

## 2024-03-19 DIAGNOSIS — R319 Hematuria, unspecified: Secondary | ICD-10-CM | POA: Diagnosis not present

## 2024-03-19 DIAGNOSIS — M5441 Lumbago with sciatica, right side: Secondary | ICD-10-CM | POA: Diagnosis not present

## 2024-03-19 DIAGNOSIS — Q625 Duplication of ureter: Secondary | ICD-10-CM | POA: Diagnosis not present

## 2024-03-25 DIAGNOSIS — K625 Hemorrhage of anus and rectum: Secondary | ICD-10-CM | POA: Diagnosis not present

## 2024-03-25 DIAGNOSIS — R634 Abnormal weight loss: Secondary | ICD-10-CM | POA: Diagnosis not present

## 2024-03-25 DIAGNOSIS — K900 Celiac disease: Secondary | ICD-10-CM | POA: Diagnosis not present

## 2024-03-25 DIAGNOSIS — R198 Other specified symptoms and signs involving the digestive system and abdomen: Secondary | ICD-10-CM | POA: Diagnosis not present

## 2024-04-28 ENCOUNTER — Ambulatory Visit

## 2024-04-28 ENCOUNTER — Telehealth: Payer: Self-pay | Admitting: *Deleted

## 2024-04-28 ENCOUNTER — Other Ambulatory Visit (INDEPENDENT_AMBULATORY_CARE_PROVIDER_SITE_OTHER)

## 2024-04-28 DIAGNOSIS — Z111 Encounter for screening for respiratory tuberculosis: Secondary | ICD-10-CM

## 2024-04-28 NOTE — Telephone Encounter (Signed)
 Pt needs tb test for work.  Pt would like to have blood test.  Ok per Dr. Frann.

## 2024-04-30 ENCOUNTER — Ambulatory Visit: Payer: Self-pay | Admitting: Family Medicine

## 2024-04-30 LAB — QUANTIFERON-TB GOLD PLUS
Mitogen-NIL: >10 [IU]/mL
NIL: 0.03 [IU]/mL
QuantiFERON-TB Gold Plus: NEGATIVE
TB1-NIL: 0.01 [IU]/mL
TB2-NIL: 0 [IU]/mL
